# Patient Record
Sex: Male | Born: 1990 | Race: Black or African American | Hispanic: No | Marital: Single | Smoking: Never smoker
Health system: Southern US, Community
[De-identification: ages and names within clinical notes are randomized; demographics above are authoritative.]

## PROBLEM LIST (undated history)

## (undated) DIAGNOSIS — I472 Ventricular tachycardia, unspecified: Secondary | ICD-10-CM

## (undated) DIAGNOSIS — I43 Cardiomyopathy in diseases classified elsewhere: Secondary | ICD-10-CM

## (undated) DIAGNOSIS — B999 Unspecified infectious disease: Secondary | ICD-10-CM

## (undated) DIAGNOSIS — F419 Anxiety disorder, unspecified: Secondary | ICD-10-CM

---
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
## 2012-04-25 ENCOUNTER — Encounter: Payer: Self-pay | Admitting: Cardiovascular Disease

## 2012-05-18 ENCOUNTER — Encounter: Payer: Self-pay | Admitting: Cardiovascular Disease

## 2012-06-18 ENCOUNTER — Encounter: Payer: Self-pay | Admitting: Cardiovascular Disease

## 2012-07-18 ENCOUNTER — Encounter: Payer: Self-pay | Admitting: Cardiovascular Disease

## 2012-08-11 ENCOUNTER — Emergency Department: Payer: Self-pay | Admitting: *Deleted

## 2012-08-11 LAB — COMPREHENSIVE METABOLIC PANEL
Albumin: 4.2 g/dL (ref 3.4–5.0)
Alkaline Phosphatase: 80 U/L (ref 50–136)
Anion Gap: 10 (ref 7–16)
BUN: 10 mg/dL (ref 7–18)
Bilirubin,Total: 2 mg/dL — ABNORMAL HIGH (ref 0.2–1.0)
Calcium, Total: 9 mg/dL (ref 8.5–10.1)
Chloride: 105 mmol/L (ref 98–107)
Co2: 27 mmol/L (ref 21–32)
EGFR (Non-African Amer.): >60
Osmolality: 282 (ref 275–301)
Potassium: 3.5 mmol/L (ref 3.5–5.1)
Sodium: 142 mmol/L (ref 136–145)
Total Protein: 7.4 g/dL (ref 6.4–8.2)

## 2012-08-11 LAB — CBC WITH DIFFERENTIAL/PLATELET
Basophil %: 0.6 %
Eosinophil %: 3 %
HGB: 15.4 g/dL (ref 13.0–18.0)
Lymphocyte %: 35.1 %
MCH: 32.2 pg (ref 26.0–34.0)
Monocyte #: 0.4 x10 3/mm (ref 0.2–1.0)
Monocyte %: 10.4 %
Neutrophil #: 2 10*3/uL (ref 1.4–6.5)
Neutrophil %: 50.9 %
Platelet: 138 10*3/uL — ABNORMAL LOW (ref 150–440)
RBC: 4.79 10*6/uL (ref 4.40–5.90)
WBC: 3.8 10*3/uL (ref 3.8–10.6)

## 2012-08-11 LAB — MAGNESIUM: Magnesium: 1.8 mg/dL

## 2012-08-18 ENCOUNTER — Encounter: Payer: Self-pay | Admitting: Cardiovascular Disease

## 2012-09-17 ENCOUNTER — Encounter: Payer: Self-pay | Admitting: Cardiovascular Disease

## 2012-10-18 ENCOUNTER — Encounter: Payer: Self-pay | Admitting: Cardiovascular Disease

## 2012-11-18 ENCOUNTER — Encounter: Payer: Self-pay | Admitting: Cardiovascular Disease

## 2012-12-16 ENCOUNTER — Encounter: Payer: Self-pay | Admitting: Cardiovascular Disease

## 2013-10-22 ENCOUNTER — Emergency Department: Payer: Self-pay | Admitting: Emergency Medicine

## 2013-10-22 LAB — COMPREHENSIVE METABOLIC PANEL
AST: 27 U/L (ref 15–37)
Albumin: 3.7 g/dL (ref 3.4–5.0)
Alkaline Phosphatase: 78 U/L
Anion Gap: 9 (ref 7–16)
BUN: 13 mg/dL (ref 7–18)
Bilirubin,Total: 1.2 mg/dL — ABNORMAL HIGH (ref 0.2–1.0)
CHLORIDE: 103 mmol/L (ref 98–107)
CO2: 22 mmol/L (ref 21–32)
Calcium, Total: 8.6 mg/dL (ref 8.5–10.1)
Creatinine: 1.26 mg/dL (ref 0.60–1.30)
EGFR (African American): >60
GLUCOSE: 185 mg/dL — AB (ref 65–99)
Osmolality: 273 (ref 275–301)
Potassium: 3.6 mmol/L (ref 3.5–5.1)
SGPT (ALT): 23 U/L (ref 12–78)
SODIUM: 134 mmol/L — AB (ref 136–145)
Total Protein: 7.2 g/dL (ref 6.4–8.2)

## 2013-10-22 LAB — URINALYSIS, COMPLETE
BACTERIA: NONE SEEN
BILIRUBIN, UR: NEGATIVE
Blood: NEGATIVE
Glucose,UR: NEGATIVE mg/dL (ref 0–75)
Ketone: NEGATIVE
Leukocyte Esterase: NEGATIVE
Nitrite: NEGATIVE
PROTEIN: NEGATIVE
Ph: 7 (ref 4.5–8.0)
Specific Gravity: 1.01 (ref 1.003–1.030)
Squamous Epithelial: <1
WBC UR: <1 /HPF (ref 0–5)

## 2013-10-22 LAB — CBC
HCT: 44 % (ref 40.0–52.0)
HGB: 15.4 g/dL (ref 13.0–18.0)
MCH: 31.7 pg (ref 26.0–34.0)
MCHC: 34.9 g/dL (ref 32.0–36.0)
MCV: 91 fL (ref 80–100)
Platelet: 157 10*3/uL (ref 150–440)
RBC: 4.85 10*6/uL (ref 4.40–5.90)
RDW: 13.2 % (ref 11.5–14.5)
WBC: 4.7 10*3/uL (ref 3.8–10.6)

## 2013-10-22 LAB — CK TOTAL AND CKMB (NOT AT ARMC)
CK, Total: 143 U/L (ref 35–232)
CK-MB: 1.1 ng/mL (ref 0.5–3.6)

## 2013-10-22 LAB — DRUG SCREEN, URINE
Amphetamines, Ur Screen: NEGATIVE (ref ?–1000)
BENZODIAZEPINE, UR SCRN: NEGATIVE (ref ?–200)
Barbiturates, Ur Screen: NEGATIVE (ref ?–200)
Cannabinoid 50 Ng, Ur ~~LOC~~: POSITIVE (ref ?–50)
Cocaine Metabolite,Ur ~~LOC~~: NEGATIVE (ref ?–300)
MDMA (ECSTASY) UR SCREEN: NEGATIVE (ref ?–500)
Methadone, Ur Screen: NEGATIVE (ref ?–300)
Opiate, Ur Screen: NEGATIVE (ref ?–300)
Phencyclidine (PCP) Ur S: NEGATIVE (ref ?–25)
TRICYCLIC, UR SCREEN: NEGATIVE (ref ?–1000)

## 2013-10-22 LAB — TROPONIN I

## 2014-03-13 ENCOUNTER — Encounter (HOSPITAL_COMMUNITY): Payer: Self-pay | Admitting: Emergency Medicine

## 2014-03-13 ENCOUNTER — Emergency Department (HOSPITAL_COMMUNITY): Payer: BC Managed Care – PPO

## 2014-03-13 ENCOUNTER — Emergency Department (HOSPITAL_COMMUNITY)
Admission: EM | Admit: 2014-03-13 | Discharge: 2014-03-13 | Disposition: A | Discharge status: Other Acute Inpatient Hospital | Payer: BC Managed Care – PPO | Attending: Emergency Medicine | Admitting: Emergency Medicine

## 2014-03-13 DIAGNOSIS — R5383 Other fatigue: Secondary | ICD-10-CM

## 2014-03-13 DIAGNOSIS — R42 Dizziness and giddiness: Secondary | ICD-10-CM | POA: Insufficient documentation

## 2014-03-13 DIAGNOSIS — T82198A Other mechanical complication of other cardiac electronic device, initial encounter: Secondary | ICD-10-CM | POA: Insufficient documentation

## 2014-03-13 DIAGNOSIS — Z4502 Encounter for adjustment and management of automatic implantable cardiac defibrillator: Secondary | ICD-10-CM

## 2014-03-13 DIAGNOSIS — Y831 Surgical operation with implant of artificial internal device as the cause of abnormal reaction of the patient, or of later complication, without mention of misadventure at the time of the procedure: Secondary | ICD-10-CM | POA: Insufficient documentation

## 2014-03-13 DIAGNOSIS — R5381 Other malaise: Secondary | ICD-10-CM | POA: Insufficient documentation

## 2014-03-13 HISTORY — DX: Anxiety disorder, unspecified: F41.9

## 2014-03-13 HISTORY — DX: Unspecified infectious disease: B99.9

## 2014-03-13 HISTORY — DX: Cardiomyopathy in diseases classified elsewhere: I43

## 2014-03-13 LAB — CBC WITH DIFFERENTIAL/PLATELET
BASOS ABS: 0 10*3/uL (ref 0.0–0.1)
Basophils Relative: 0 % (ref 0–1)
EOS ABS: 0.2 10*3/uL (ref 0.0–0.7)
Eosinophils Relative: 3 % (ref 0–5)
HEMATOCRIT: 43.2 % (ref 39.0–52.0)
HEMOGLOBIN: 15.6 g/dL (ref 13.0–17.0)
Lymphocytes Relative: 22 % (ref 12–46)
Lymphs Abs: 1.3 10*3/uL (ref 0.7–4.0)
MCH: 32.3 pg (ref 26.0–34.0)
MCHC: 36.1 g/dL — AB (ref 30.0–36.0)
MCV: 89.4 fL (ref 78.0–100.0)
MONO ABS: 0.4 10*3/uL (ref 0.1–1.0)
MONOS PCT: 7 % (ref 3–12)
NEUTROS PCT: 68 % (ref 43–77)
Neutro Abs: 4 10*3/uL (ref 1.7–7.7)
Platelets: 157 10*3/uL (ref 150–400)
RBC: 4.83 MIL/uL (ref 4.22–5.81)
RDW: 12.6 % (ref 11.5–15.5)
WBC: 5.9 10*3/uL (ref 4.0–10.5)

## 2014-03-13 LAB — I-STAT TROPONIN, ED: Troponin i, poc: 0.08 ng/mL (ref 0.00–0.08)

## 2014-03-13 MED ORDER — AMIODARONE HCL IN DEXTROSE 360-4.14 MG/200ML-% IV SOLN
30.0000 mg/h | INTRAVENOUS | Status: DC
  Filled 2014-03-13: qty 200

## 2014-03-13 MED ORDER — AMIODARONE LOAD VIA INFUSION
150.0000 mg | Freq: Once | INTRAVENOUS | Status: AC
  Administered 2014-03-13: 150 mg via INTRAVENOUS
  Filled 2014-03-13: qty 83.34

## 2014-03-13 MED ORDER — AMIODARONE HCL IN DEXTROSE 360-4.14 MG/200ML-% IV SOLN
60.0000 mg/h | INTRAVENOUS | Status: DC
  Filled 2014-03-13 (×2): qty 200

## 2014-03-13 NOTE — Discharge Instructions (Signed)
You have been evaluate for your defibrillator discharge.  We will transfer you to cardiologist at [facility] for further management.

## 2014-03-13 NOTE — ED Notes (Signed)
Dr. Yao at the bedside. 

## 2014-03-13 NOTE — ED Notes (Signed)
Spoke with [employer] Rep. Patient currently on the [employer] Jude Transmission monitor.

## 2014-03-13 NOTE — ED Notes (Addendum)
Per EMS: Pt at work when he states "It started to feel funny and lightheaded" and then felt pacemaker fired. Pt AO x4, denies dizziness, CP, SOB or pain. EKG showed NSR with PVC's  Hx: dilated cardiomyopathy. 140/82. 86 reg. 18 RR.

## 2014-03-13 NOTE — ED Notes (Signed)
Report attempted to be called x1. [facility] stated they did not have the room assigned yet.

## 2014-03-13 NOTE — ED Notes (Signed)
Patient returned from Xray. Placed back on the monitor.  

## 2014-03-13 NOTE — ED Notes (Signed)
Gail, NP at the bedside.  

## 2014-03-13 NOTE — ED Provider Notes (Signed)
After several discussions with the transfer center at [facility].  Patient has been accepted by Dr. Mickle Asper to a bed at [facility].  This was discussed with the patient and his family.  Because the accepting  Cardiology is with a different group than his normal.  Cardiology, Dr. Yolanda Bonine, but is stated by Dr. Shiela Mayer that she will be working with Dr. Evelene Croon to coordinate patient's care.  He is agreeable to this transfer  Arman Filter, NP 03/13/14 2232

## 2014-03-13 NOTE — ED Notes (Signed)
RN attempted to interrogate Medtronic defibrillator without success. [employer] paged.

## 2014-03-13 NOTE — ED Notes (Signed)
Sandy from [place]. Jude called and stated she was unable to receive transmission of the Garment/textile technologist. Interrogator plugged back up to the receiver and waiting to transmit.

## 2014-03-13 NOTE — ED Notes (Signed)
[employer] called and reported patient had a heartrate of 250 and did receive a shock from his defibrillator. Greta Doom, PA notified.

## 2014-03-13 NOTE — ED Notes (Signed)
Phlebotomy at the bedside  

## 2014-03-13 NOTE — ED Provider Notes (Signed)
CSN: [id number]     Arrival date & time 03/13/14  1649 History   First MD Initiated Contact with Patient 03/13/14 1650     Chief Complaint  Patient presents with  . Pacemaker Problem     (Consider location/radiation/quality/duration/timing/severity/associated sxs/prior Treatment) HPI  23 year old male who presents for evaluation of "defibrillator firing". Patient reports he was sitting at work today when he felt funny follows with sensation of lightheadedness and weakness and shortly he felt that his pacemaker has recently fired. After the firing he felt normal again. He reports having a similar firing back in December of last year, and [facility]. He otherwise denies any recent sickness.  No active fever, chills, cp, sob, productive cough, n/v/d, diaphoresis, or any other acute changes.  Hx of dilated cardiomyopathy likely 2/2 to infection of unknown sources causing his sxs.  He denies any significant family hx of cardiac disease.  Not an IVDU, not a smoker, denies alcohol abuse.  [facility].      No past medical history on file. No past surgical history on file. No family history on file. History  Substance Use Topics  . Smoking status: Not on file  . Smokeless tobacco: Not on file  . Alcohol Use: Not on file    Review of Systems  All other systems reviewed and are negative.     Allergies  Review of patient's allergies indicates not on file.  Home Medications   Prior to Admission medications   Not on File   There were no vitals taken for this visit. Physical Exam  Constitutional: He is oriented to person, place, and time. He appears well-developed and well-nourished. No distress.  HENT:  Head: Atraumatic.  Eyes: Conjunctivae are normal.  Neck: Normal range of motion. Neck supple.  Cardiovascular: Normal rate and regular rhythm.   Pulmonary/Chest: Effort normal and breath sounds normal. He has no wheezes. He exhibits no tenderness.  ICD  to Left upper chest, normal appearance, nontender on exam.    Abdominal: Soft. There is no tenderness.  Musculoskeletal: He exhibits no edema.  Neurological: He is alert and oriented to person, place, and time.  Skin: No rash noted.  Psychiatric: He has a normal mood and affect.    ED Course  Procedures (including critical care time)  5:14 PM Pt report firing of his defibrillator at work today.  He is back to normal baseline.  Pt has medtronic ICD.  I will interrogate it.  Will check basic labs, CXR, trop and will consult cardiologist with result.  Care discussed with Dr. Silverio LayYao.  5:48 PM Pharmacy tech report that pt has not been compliant with his medications including his BP medication.    8:20 PM Pt's ICD was interrogated and demonstrates sustained VFIB when pt was shocked.  Therefore, we will load pt with antiarrhythmic, amiodarone and will call [facility] transfer line [facility].  Pt current hemodynamically stable and in NAD.  8:37 PM I have consulted with [facility] cardiology department who request [facility].  Cardiologist is Dr. Deatra RobinsonPam Douglas.    8:47 PM The PALS line report that currently [facility] ER does not accept transfer, however they will contact their sister site [facility] for [facility].  Pt agrees with plan.  Care discussed with oncoming provider who will talk to [facility] [place] transfer.  Pt previous cardiologist  is Dr. Willia Craze.  He is schedule to f/u with another cardiologist on June 22nd.    Labs Review Labs Reviewed  CBC WITH DIFFERENTIAL - Abnormal; Notable for the following:    MCHC 36.1 (*)    All other components within normal limits  I-STAT CHEM 8, ED  Rosezena Sensor, ED    Imaging Review Dg Chest 2 View  03/13/2014   CLINICAL DATA:  Defibrillator fired. Dilated cardiomyopathy. Tachycardia.  EXAM: CHEST  2 VIEW  COMPARISON:  10/22/2013  FINDINGS: Borderline cardiomegaly  remains stable. Single lead transvenous pacemaker remains in appropriate position. No evidence of pulmonary infiltrate or edema. No evidence of pneumothorax or pleural effusion. No mass or lymphadenopathy identified.  IMPRESSION: Stable borderline cardiomegaly.  No active lung disease.   Electronically Signed   By: Myles Rosenthal M.D.   On: 03/13/2014 18:55     EKG Interpretation   Date/Time:  Wednesday Mar 13 2014 16:57:19 EDT Ventricular Rate:  83 PR Interval:  202 QRS Duration: 112 QT Interval:  378 QTC Calculation: 444 R Axis:   -90 Text Interpretation:  Sinus rhythm Borderline prolonged PR interval Left  anterior fascicular block Consider right ventricular hypertrophy Abnrm T,  consider ischemia, anterolateral lds No previous ECGs available Confirmed  by YAO  MD, DAVID ([id number]) on 03/13/2014 5:01:41 PM      MDM   Final diagnoses:  Defibrillator discharge    BP 114/69  Pulse 80  Temp(Src) 97.7 F (36.5 C) (Oral)  Resp 17  SpO2 99%  I have reviewed nursing notes and vital signs. I personally reviewed the imaging tests through PACS system  I reviewed available ER/hospitalization records thought the EMR     Fayrene Helper, [place] 03/13/14 2052

## 2014-03-13 NOTE — ED Notes (Signed)
Patient has medtronic card that sts ICD is St. Jude. Patient unaware of type of defibrillator. [employer] Jude to be paged.

## 2014-03-14 NOTE — ED Provider Notes (Signed)
Medical screening examination/treatment/procedure(s) were conducted as a shared visit with non-physician practitioner(s) and myself.  I personally evaluated the patient during the encounter.   EKG Interpretation   Date/Time:  Wednesday Mar 13 2014 16:57:19 EDT Ventricular Rate:  83 PR Interval:  202 QRS Duration: 112 QT Interval:  378 QTC Calculation: 444 R Axis:   -90 Text Interpretation:  Sinus rhythm Borderline prolonged PR interval Left  anterior fascicular block Consider right ventricular hypertrophy Abnrm T,  consider ischemia, anterolateral lds No previous ECGs available Confirmed  by Dalicia Kisner  MD, Danne Vasek ([id number]) on 03/13/2014 5:01:41 PM      Stephano Aronow is a 23 y.o. male hx of cardiomyopathy with pacemaker here because he felt that pacemaker might have fired earlier today. Denies chest pain currently. Labs stable, trop neg. Pacemaker interrogated and patient had a run of V tach. Patient loaded with amiodarone and started on amiodarone drip. The PA contacted [facility] (pacemaker placed there) and will arrange for transfer for V tach causing pacemaker to fire.   CRITICAL CARE Performed by: Richardean Canal   Total critical care time: 30 min  Critical care time was exclusive of separately billable procedures and treating other patients.  Critical care was necessary to treat or prevent imminent or life-threatening deterioration.  Critical care was time spent personally by me on the following activities: development of treatment plan with patient and/or surrogate as well as nursing, discussions with consultants, evaluation of patient's response to treatment, examination of patient, obtaining history from patient or surrogate, ordering and performing treatments and interventions, ordering and review of laboratory studies, ordering and review of radiographic studies, pulse oximetry and re-evaluation of patient's condition.    Richardean Canal, MD 03/14/14 [phone]

## 2014-03-14 NOTE — ED Provider Notes (Signed)
Medical screening examination/treatment/procedure(s) were conducted as a shared visit with non-physician practitioner(s) and myself.  I personally evaluated the patient during the encounter.   EKG Interpretation   Date/Time:  Wednesday Mar 13 2014 16:57:19 EDT Ventricular Rate:  83 PR Interval:  202 QRS Duration: 112 QT Interval:  378 QTC Calculation: 444 R Axis:   -90 Text Interpretation:  Sinus rhythm Borderline prolonged PR interval Left  anterior fascicular block Consider right ventricular hypertrophy Abnrm T,  consider ischemia, anterolateral lds No previous ECGs available Confirmed  by YAO  MD, DAVID ([id number]) on 03/13/2014 5:01:41 PM      See my separate note   Richardean Canal, MD 03/14/14 [phone]

## 2014-10-14 ENCOUNTER — Emergency Department (HOSPITAL_COMMUNITY): Payer: BC Managed Care – PPO

## 2014-10-14 ENCOUNTER — Inpatient Hospital Stay (HOSPITAL_COMMUNITY)
Admission: EM | Admit: 2014-10-14 | Discharge: 2014-10-18 | DRG: 309 | Disposition: A | Discharge status: Home / Self Care | Payer: BC Managed Care – PPO | Attending: Internal Medicine | Admitting: Internal Medicine

## 2014-10-14 ENCOUNTER — Encounter (HOSPITAL_COMMUNITY): Payer: Self-pay | Admitting: *Deleted

## 2014-10-14 DIAGNOSIS — I472 Ventricular tachycardia, unspecified: Secondary | ICD-10-CM

## 2014-10-14 DIAGNOSIS — I420 Dilated cardiomyopathy: Secondary | ICD-10-CM | POA: Diagnosis present

## 2014-10-14 DIAGNOSIS — Z95810 Presence of automatic (implantable) cardiac defibrillator: Secondary | ICD-10-CM

## 2014-10-14 DIAGNOSIS — I5022 Chronic systolic (congestive) heart failure: Secondary | ICD-10-CM | POA: Diagnosis present

## 2014-10-14 DIAGNOSIS — I514 Myocarditis, unspecified: Secondary | ICD-10-CM | POA: Diagnosis present

## 2014-10-14 DIAGNOSIS — F419 Anxiety disorder, unspecified: Secondary | ICD-10-CM | POA: Diagnosis present

## 2014-10-14 DIAGNOSIS — R0602 Shortness of breath: Secondary | ICD-10-CM

## 2014-10-14 DIAGNOSIS — I959 Hypotension, unspecified: Secondary | ICD-10-CM | POA: Diagnosis present

## 2014-10-14 HISTORY — DX: Ventricular tachycardia: I47.2

## 2014-10-14 HISTORY — DX: Ventricular tachycardia, unspecified: I47.20

## 2014-10-14 LAB — CBC WITH DIFFERENTIAL/PLATELET
Basophils Absolute: 0 10*3/uL (ref 0.0–0.1)
Basophils Relative: 0 % (ref 0–1)
EOS ABS: 0.1 10*3/uL (ref 0.0–0.7)
Eosinophils Relative: 1 % (ref 0–5)
HEMATOCRIT: 45.4 % (ref 39.0–52.0)
Hemoglobin: 16.2 g/dL (ref 13.0–17.0)
LYMPHS ABS: 0.6 10*3/uL — AB (ref 0.7–4.0)
Lymphocytes Relative: 7 % — ABNORMAL LOW (ref 12–46)
MCH: 31.6 pg (ref 26.0–34.0)
MCHC: 35.7 g/dL (ref 30.0–36.0)
MCV: 88.5 fL (ref 78.0–100.0)
MONO ABS: 0.4 10*3/uL (ref 0.1–1.0)
MONOS PCT: 5 % (ref 3–12)
Neutro Abs: 7.5 10*3/uL (ref 1.7–7.7)
Neutrophils Relative %: 87 % — ABNORMAL HIGH (ref 43–77)
Platelets: 155 10*3/uL (ref 150–400)
RBC: 5.13 MIL/uL (ref 4.22–5.81)
RDW: 12.5 % (ref 11.5–15.5)
WBC: 8.7 10*3/uL (ref 4.0–10.5)

## 2014-10-14 LAB — I-STAT TROPONIN, ED: Troponin i, poc: 0.16 ng/mL (ref 0.00–0.08)

## 2014-10-14 LAB — I-STAT CHEM 8, ED
BUN: 8 mg/dL (ref 6–23)
Calcium, Ion: 1.15 mmol/L (ref 1.12–1.23)
Chloride: 102 mEq/L (ref 96–112)
Creatinine, Ser: 1.2 mg/dL (ref 0.50–1.35)
Glucose, Bld: 89 mg/dL (ref 70–99)
HCT: 50 % (ref 39.0–52.0)
Hemoglobin: 17 g/dL (ref 13.0–17.0)
Potassium: 4.3 mmol/L (ref 3.5–5.1)
SODIUM: 139 mmol/L (ref 135–145)
TCO2: 23 mmol/L (ref 0–100)

## 2014-10-14 LAB — MRSA PCR SCREENING: MRSA BY PCR: NEGATIVE

## 2014-10-14 MED ORDER — ACETAMINOPHEN 325 MG PO TABS: 650.0000 mg | ORAL_TABLET | Freq: Four times a day (QID) | ORAL | Status: DC | PRN

## 2014-10-14 MED ORDER — LORATADINE 10 MG PO TABS
10.0000 mg | ORAL_TABLET | Freq: Every day | ORAL | Status: DC
  Administered 2014-10-14 – 2014-10-18 (×5): 10 mg via ORAL
  Filled 2014-10-14 (×5): qty 1

## 2014-10-14 MED ORDER — ONDANSETRON HCL 4 MG/2ML IJ SOLN: 4.0000 mg | Freq: Four times a day (QID) | INTRAMUSCULAR | Status: DC | PRN

## 2014-10-14 MED ORDER — AMIODARONE HCL IN DEXTROSE 360-4.14 MG/200ML-% IV SOLN
INTRAVENOUS | Status: AC
  Filled 2014-10-14: qty 200

## 2014-10-14 MED ORDER — AMIODARONE HCL IN DEXTROSE 360-4.14 MG/200ML-% IV SOLN
60.0000 mg/h | Freq: Once | INTRAVENOUS | Status: AC
  Administered 2014-10-14 (×2): 60 mg/h via INTRAVENOUS

## 2014-10-14 MED ORDER — SODIUM CHLORIDE 0.9 % IV BOLUS (SEPSIS)
500.0000 mL | Freq: Once | INTRAVENOUS | Status: AC
  Administered 2014-10-15: 500 mL via INTRAVENOUS

## 2014-10-14 MED ORDER — LISINOPRIL 2.5 MG PO TABS
2.5000 mg | ORAL_TABLET | Freq: Every day | ORAL | Status: DC
  Administered 2014-10-14 – 2014-10-15 (×2): 2.5 mg via ORAL
  Filled 2014-10-14 (×2): qty 1

## 2014-10-14 MED ORDER — ENOXAPARIN SODIUM 40 MG/0.4ML ~~LOC~~ SOLN
40.0000 mg | SUBCUTANEOUS | Status: DC
  Administered 2014-10-14 – 2014-10-17 (×4): 40 mg via SUBCUTANEOUS
  Filled 2014-10-14 (×5): qty 0.4

## 2014-10-14 MED ORDER — AMIODARONE HCL IN DEXTROSE 360-4.14 MG/200ML-% IV SOLN
INTRAVENOUS | Status: AC
  Administered 2014-10-14: 60 mg/h via INTRAVENOUS
  Filled 2014-10-14: qty 200

## 2014-10-14 MED ORDER — ALPRAZOLAM 0.25 MG PO TABS
0.2500 mg | ORAL_TABLET | Freq: Two times a day (BID) | ORAL | Status: DC | PRN
  Administered 2014-10-16: 0.25 mg via ORAL
  Filled 2014-10-14 (×2): qty 1

## 2014-10-14 MED ORDER — SOTALOL HCL 80 MG PO TABS
160.0000 mg | ORAL_TABLET | Freq: Two times a day (BID) | ORAL | Status: DC
  Administered 2014-10-14 – 2014-10-15 (×2): 160 mg via ORAL
  Filled 2014-10-14 (×3): qty 2

## 2014-10-14 MED ORDER — ACETAMINOPHEN 325 MG PO TABS
650.0000 mg | ORAL_TABLET | ORAL | Status: DC | PRN
  Administered 2014-10-14 – 2014-10-15 (×2): 650 mg via ORAL
  Filled 2014-10-14 (×3): qty 2

## 2014-10-14 MED ORDER — ZOLPIDEM TARTRATE 5 MG PO TABS: 5.0000 mg | ORAL_TABLET | Freq: Every evening | ORAL | Status: DC | PRN

## 2014-10-14 NOTE — ED Notes (Signed)
Pt arrived via [facility] from home. Pt had a near syncopal episode and was in vtach when EMS arrived. Pt had 75mg  of amiodarone and converted PTA. Pt was in v tach once roomed in ED and received a 75mg  bolus of amiodarone. Pt has a hx of dilated cardiomyopathy and has a defibrillator/pacemaker. Pt was found htn and after amio drip was hypotensive received 300ml of NS.

## 2014-10-14 NOTE — ED Provider Notes (Signed)
CSN: [id number]     Arrival date & time 10/14/14  1402 History   First MD Initiated Contact with Patient 10/14/14 1415     Chief Complaint  Patient presents with  . Tachycardia     (Consider location/radiation/quality/duration/timing/severity/associated sxs/prior Treatment) HPI   Matthew Woodward is a 23 y.o. male who presents for evaluation of palpitations.  The palpitations have been present for one hour prior to coming here.  He called EMS and was transferred here for evaluation.  During transport.  EMS noted that he was in ventricular tachycardia.  They treated him with IV amiodarone.  He had transient improvement of the headache, arrhythmia, but upon arrival, he was in V. tach again.  The patient was able to communicate and seems to be tolerating the V. tach very well.  He denies recent problems similar to this.  He states that his defibrillator, has not fired.  He recently had some medication changes, but he is not sure what they are.  He does not have any local cardiology care.  He denies recent fever, chills, nausea, vomiting, cough, shortness of breath or episodes of chest pain.  There are no other known modifying factors.  Past Medical History  Diagnosis Date  . 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 Anxiety   . Ventricular tachycardia    Past Surgical History  Procedure Laterality Date  . Implantable cardioverter defibrillator implant  2012    STJ single chamber ICD implanted at [facility] - followed by Dr Gerre Pebbles   Family History  Problem Relation Age of Onset  . Diabetes Mother   . Thyroid disease Mother    History  Substance Use Topics  . Smoking status: Never Smoker   . Smokeless tobacco: Not on file  . Alcohol Use: No    Review of Systems  All other systems reviewed and are negative.     Allergies  Review of patient's allergies indicates no known allergies.  Home Medications   Prior to Admission medications   Medication Sig Start Date End  Date Taking? Authorizing Provider  acetaminophen (TYLENOL) 325 MG tablet Take 650 mg by mouth every 6 (six) hours as needed for mild pain.   Yes Historical Provider, MD  cetirizine (ZYRTEC) 10 MG tablet Take 10 mg by mouth daily.   Yes Historical Provider, MD  lisinopril (PRINIVIL,ZESTRIL) 2.5 MG tablet Take 2.5 mg by mouth daily.   Yes Historical Provider, MD  sotalol (BETAPACE) 80 MG tablet Take 80 mg by mouth 2 (two) times daily.   Yes Historical Provider, MD   BP 99/79 mmHg  Pulse 84  Resp 19  Ht 6' (1.829 m)  SpO2 100% Physical Exam  Constitutional: He is oriented to person, place, and time. He appears well-developed and well-nourished.  HENT:  Head: Normocephalic and atraumatic.  Right Ear: External ear normal.  Left Ear: External ear normal.  Eyes: Conjunctivae and EOM are normal. Pupils are equal, round, and reactive to light.  Neck: Normal range of motion and phonation normal. Neck supple.  Cardiovascular: Normal heart sounds.   Tachycardia.  Faint right radial pulse with intermittent pulsations indicating incomplete perfusion of all cardiac beats.  Pulmonary/Chest: Effort normal and breath sounds normal. He exhibits no bony tenderness.  Abdominal: Soft. There is no tenderness.  Musculoskeletal: Normal range of motion.  Neurological: He is alert and oriented to person, place, and time. No cranial nerve deficit or sensory deficit. He exhibits normal muscle tone. Coordination normal.  Skin: Skin  is warm, dry and intact.  Psychiatric: He has a normal mood and affect. His behavior is normal. Judgment and thought content normal.  Nursing note and vitals reviewed.   ED Course  Procedures (including critical care time)  Initial urgent treatment.  He was given additional in your arm bolus, 50 mg, and started on amiodarone drip/  Pacer pads, and difficult bladder, placed, as precaution.  Consultation with cardiology  Medications  acetaminophen (TYLENOL) tablet 650 mg (not  administered)  loratadine (CLARITIN) tablet 10 mg (10 mg Oral Given 10/14/14 1517)  lisinopril (PRINIVIL,ZESTRIL) tablet 2.5 mg (not administered)  amiodarone (NEXTERONE PREMIX) 360 MG/200ML (1.8 mg/mL) IV infusion (60 mg/hr Intravenous New Bag/Given 10/14/14 1410)    Patient Vitals for the past 24 hrs:  BP Pulse Resp SpO2 Height  10/14/14 1500 99/79 mmHg 84 19 100 % -  10/14/14 1445 127/87 mmHg 96 20 99 % -  10/14/14 1430 112/73 mmHg 86 15 99 % -  10/14/14 1417 - - - - 6' (1.829 m)  10/14/14 1415 92/56 mmHg 89 22 100 % -  10/14/14 1410 (!) 86/65 mmHg (!) 168 23 99 % -   Cardiology arrived and decided to admit the patient  CRITICAL CARE Performed by: Flint MelterWENTZ,Quintan Saldivar L Total critical care time: 30 minutes Critical care time was exclusive of separately billable procedures and treating other patients. Critical care was necessary to treat or prevent imminent or life-threatening deterioration. Critical care was time spent personally by me on the following activities: development of treatment plan with patient and/or surrogate as well as nursing, discussions with consultants, evaluation of patient's response to treatment, examination of patient, obtaining history from patient or surrogate, ordering and performing treatments and interventions, ordering and review of laboratory studies, ordering and review of radiographic studies, pulse oximetry and re-evaluation of patient's condition.   Labs Review Labs Reviewed  CBC WITH DIFFERENTIAL - Abnormal; Notable for the following:    Neutrophils Relative % 87 (*)    Lymphocytes Relative 7 (*)    Lymphs Abs 0.6 (*)    All other components within normal limits  I-STAT TROPOININ, ED - Abnormal; Notable for the following:    Troponin i, poc 0.16 (*)    All other components within normal limits  I-STAT CHEM 8, ED    Imaging Review Dg Chest Port 1 View  10/14/2014   CLINICAL DATA:  Diaphoresis and near syncope ; episode of ventricular tachycardia   EXAM: PORTABLE CHEST - 1 VIEW  COMPARISON:  Mar 13, 2014  FINDINGS: Defibrillator present with lead attached to the right ventricle. Lungs clear. Heart is upper normal in size with pulmonary vascularity within normal limits. No pneumothorax. No adenopathy. No bone lesions.  IMPRESSION: No edema or consolidation.  Heart upper normal in size.   Electronically Signed   By: Bretta BangWilliam  Woodruff M.D.   On: 10/14/2014 15:06     EKG Interpretation   Date/Time:  Monday October 14 2014 14:10:26 EST Ventricular Rate:  158 PR Interval:  54 QRS Duration: 141 QT Interval:  349 QTC Calculation: 566 R Axis:   -59 Text Interpretation:  Sinus tachycardia LVH with IVCD, LAD and secondary  repol abnrm Inferior infarct, old Prolonged QT interval Since last tracing  rate faster Confirmed by [facility]  MD, Kyndal Gloster ([id number]) on 10/14/2014 2:17:02  PM      MDM   Final diagnoses:  None    Ventricular tachycardia, with history of cardiomyopathy.  Mild elevation of troponin is consistent with troponin leak  associated with ventricular tachycardia.  No evidence for STEMI.  He will require admission for further treatment and assessment.   Nursing Notes Reviewed/ Care Coordinated, and agree without changes. Applicable Imaging Reviewed.  Interpretation of Laboratory Data incorporated into ED treatment  Plan: Admit    Flint MelterElliott L Andreah Goheen, MD 10/14/14 1524

## 2014-10-14 NOTE — H&P (Signed)
ELECTROPHYSIOLOGY HISTORY AND PHYSICAL    Patient ID: Matthew Woodward MRN: [id number], DOB/AGE: 22/03/1991 22 y.o.  Admit date: 10/14/2014 Date of Consult: 10/14/2014  Primary Physician: PROVIDER NOT IN SYSTEM Primary Cardiologist: Jenel LucksKarra, Ravi ([facility]) Electrophysiologist: Gerre PebblesKevin Thomas ([facility])   CC: VT  HPI:  Matthew Woodward is a 23 y.o. 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  [facility] and is s/p STJ single chamber ICD implantation in 2012.  His last appropriate ICD therapy was in May of this year for monomorphic VT at a cycle length of 230msec.  At that time, his Coreg was changed to Sotalol 80 mg twice daily.  He did well until this morning without recurrent ICD therapies.  This morning, he developed weakness and fatigue and called EMS for evaluation.  On their arrival, he was in a WCT at 200bpm.  He was given amiodarone and [facility].  He had persistent ventricular tachycardia at rates of 150-170bpm and was given more amiodarone with quieting of ventricular arrhythmias.  He states that he has been in good health recently.  He has been trying to exercise more and has done so without limitation.  He has not had recent chest pain, shortness of breath, LE edema, fevers, chills, nausea or vomiting.  He has almost daily palpitations that he has had for years and are not associated with syncope or pre-syncope.    Home meds include Sotalol 80mg  1 tablet twice daily and Lisinopril 2.5mg  1 tablet daily.  HF medications have been unable to be uptitrated further due to hypotension.  He recently [facility] with some cardiac limitation noted.   EP has been asked to admit.    Past Medical History  Diagnosis Date  . 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 Anxiety      Surgical History: History reviewed. No pertinent past  surgical history.   Prior to Admission medications   Medication Sig Start Date End Date Taking? Authorizing Provider  acetaminophen (TYLENOL) 325 MG tablet Take 650 mg by mouth every 6 (six) hours as needed for mild pain.    Historical Provider, MD  cetirizine (ZYRTEC) 10 MG tablet Take 10 mg by mouth daily.    Historical Provider, MD  lisinopril (PRINIVIL,ZESTRIL) 2.5 MG tablet Take 2.5 mg by mouth daily.    Historical Provider, MD     Allergies: No Known Allergies  History   Social History  . Marital Status: Single    Spouse Name: N/A    Number of Children: N/A  . Years of Education: N/A   Occupational History  . Not on file.   Social History Main Topics  . Smoking status: Never Smoker   . Smokeless tobacco: Not on file  . Alcohol Use: No  . Drug Use: No  . Sexual Activity: Not on file   Other Topics Concern  . Not on file   Social History Narrative     Family History  Problem Relation Age of Onset  . Diabetes Mother   . Thyroid disease Mother     BP 112/73 mmHg  Pulse 86  Resp 15  Ht 6' (1.829 m)  SpO2 99%  Physical Exam: Well appearing young man, NAD HEENT: Unremarkable,[place], AT Neck:  6 JVD, no thyromegally Back:  No CVA tenderness Lungs:  Clear with no wheezes, rales, or rhonchi, well healed ICD incision.  HEART:  Regular rate rhythm, no murmurs, no rubs, no clicks, soft S3 Abd:  soft, positive bowel sounds, no organomegally, no rebound, no guarding Ext:  2 plus pulses, no edema, no cyanosis, no clubbing Skin:  No rashes no nodules Neuro:  CN II through XII intact, motor grossly intact   Labs:   Lab Results  Component Value Date   WBC 8.7 10/14/2014   HGB 17.0 10/14/2014   HCT 50.0 10/14/2014   MCV 88.5 10/14/2014   PLT 155 10/14/2014    Recent Labs Lab 10/14/14 1432  NA 139  K 4.3  CL 102  BUN 8  CREATININE 1.20  GLUCOSE 89    Radiology/Studies: No results found.  EKG:VT, rate 158, QRS 141  TELEMETRY: sinus rhythm with  intermittent runs of VT  DEVICE HISTORY: STJ Fortify ICD model number 1257 with 6935 right ventricular lead implanted 07-10-11 by Dr [facility].  Device interrogation demonstrates normal device function with no recent ICD therapies.  VF zone reprogrammed from 220bpm to 200bpm with intervals extended from 16 beats per episode to 30 beats per episode before therapy is given.  VT monitor zone left unchanged.   A/P 1. Recurrent VT 2. Non-ischemic CM 3. Chronic class 2 CHF 4. S/p ICD Rec: he is on a very small dose of sotalol and will need attempted uptitration from 80 mg twice daily to 160 mg twice daily. Hopefully his blood pressure will tolerate. His heart failure appears to be well compensated based on his history. Hopefully he can be discharged home tomorrow. We have reprogrammed device to treat VT above 200/min.  Leonia ReevesGregg Taylor,M.D.

## 2014-10-14 NOTE — Progress Notes (Addendum)
Pt had sustained tachycardia 130-140 for two minutes. Pt has no complaints of shortness of breath or chest pain. MD notified.    Alba DestineMisty L Ennis, RN, BSN

## 2014-10-14 NOTE — ED Notes (Signed)
Dr. Taylor at bedside.

## 2014-10-14 NOTE — ED Notes (Signed)
Attempted report to [facility]. Will call back in ten minutes.

## 2014-10-14 NOTE — ED Notes (Signed)
Dr. Ladona Ridgelaylor notified of troponin 0.16 by Fayrene FearingJames from lab.

## 2014-10-14 NOTE — Progress Notes (Signed)
Pt had multiple runs of V. Tach and HR 120-180 from 2155-2211. Pt stated that he felt as if his heart was racing. He had no complaints of chest pain or shortness of breath. Cardiology MD on call has been called and text paged multiple times since 1900 with no response. RN called Elink, rapid response, and house coverage for assistance on reaching a MD. Currently pt vitals are HR 71 and BP 92/63 and resting. Will continue to monitor.    Alba DestineMisty L Ennis, RN, BSN

## 2014-10-14 NOTE — ED Notes (Signed)
 50mg  bolus of amiodarone

## 2014-10-14 NOTE — ED Notes (Signed)
Pt woke up feeling unwell this morning. Pt had a near syncopal episode and was diaphoretic with weak bilateral radial pulses, shivering and in vtach.

## 2014-10-14 NOTE — ED Notes (Signed)
 75mg  amiodarone

## 2014-10-14 NOTE — ED Notes (Signed)
Cards at bedside

## 2014-10-14 NOTE — Progress Notes (Signed)
Pr received per stretcher from ED Pt alert/oriented Pacing patches on chest

## 2014-10-14 NOTE — ED Notes (Signed)
Pt being admitted to the floor. Report given to Grand Ridgeroyce, Charity fundraiserN. Pt. Verbalizes understanding rt admission and f/u care.

## 2014-10-14 NOTE — ED Notes (Signed)
Troponin results given to Dr. Effie ShyWentz and the private cardiologist informed as well.

## 2014-10-15 DIAGNOSIS — Z95810 Presence of automatic (implantable) cardiac defibrillator: Secondary | ICD-10-CM | POA: Diagnosis not present

## 2014-10-15 DIAGNOSIS — I420 Dilated cardiomyopathy: Secondary | ICD-10-CM | POA: Diagnosis present

## 2014-10-15 DIAGNOSIS — I472 Ventricular tachycardia: Principal | ICD-10-CM

## 2014-10-15 DIAGNOSIS — I514 Myocarditis, unspecified: Secondary | ICD-10-CM | POA: Diagnosis present

## 2014-10-15 DIAGNOSIS — I369 Nonrheumatic tricuspid valve disorder, unspecified: Secondary | ICD-10-CM

## 2014-10-15 DIAGNOSIS — F419 Anxiety disorder, unspecified: Secondary | ICD-10-CM | POA: Diagnosis present

## 2014-10-15 DIAGNOSIS — I5022 Chronic systolic (congestive) heart failure: Secondary | ICD-10-CM | POA: Diagnosis present

## 2014-10-15 DIAGNOSIS — I959 Hypotension, unspecified: Secondary | ICD-10-CM | POA: Diagnosis present

## 2014-10-15 LAB — BASIC METABOLIC PANEL
ANION GAP: 7 (ref 5–15)
BUN: 6 mg/dL (ref 6–23)
CO2: 24 mmol/L (ref 19–32)
Calcium: 8.6 mg/dL (ref 8.4–10.5)
Chloride: 106 mEq/L (ref 96–112)
Creatinine, Ser: 1.13 mg/dL (ref 0.50–1.35)
Glucose, Bld: 95 mg/dL (ref 70–99)
POTASSIUM: 3.6 mmol/L (ref 3.5–5.1)
Sodium: 137 mmol/L (ref 135–145)

## 2014-10-15 MED ORDER — SODIUM CHLORIDE 0.9 % IV BOLUS (SEPSIS)
250.0000 mL | Freq: Once | INTRAVENOUS | Status: AC
  Administered 2014-10-15: 250 mL via INTRAVENOUS

## 2014-10-15 MED ORDER — LORAZEPAM 0.5 MG PO TABS
0.5000 mg | ORAL_TABLET | Freq: Two times a day (BID) | ORAL | Status: DC
  Administered 2014-10-15 – 2014-10-18 (×7): 0.5 mg via ORAL
  Filled 2014-10-15 (×6): qty 1

## 2014-10-15 MED ORDER — PHENOL 1.4 % MT LIQD
1.0000 | OROMUCOSAL | Status: DC | PRN
  Administered 2014-10-15 – 2014-10-16 (×2): 1 via OROMUCOSAL
  Filled 2014-10-15: qty 177

## 2014-10-15 MED ORDER — BENZOCAINE (TOPICAL) 20 % EX AERO: INHALATION_SPRAY | Freq: Four times a day (QID) | CUTANEOUS | Status: DC | PRN

## 2014-10-15 MED ORDER — CETYLPYRIDINIUM CHLORIDE 0.05 % MT LIQD
7.0000 mL | Freq: Two times a day (BID) | OROMUCOSAL | Status: DC
  Administered 2014-10-15 – 2014-10-17 (×3): 7 mL via OROMUCOSAL

## 2014-10-15 MED ORDER — AMIODARONE HCL IN DEXTROSE 360-4.14 MG/200ML-% IV SOLN: 30.0000 mg/h | INTRAVENOUS | Status: DC

## 2014-10-15 MED ORDER — PERFLUTREN LIPID MICROSPHERE
1.0000 mL | INTRAVENOUS | Status: AC | PRN
  Administered 2014-10-15 (×2): 2 mL via INTRAVENOUS

## 2014-10-15 MED ORDER — POTASSIUM CHLORIDE CRYS ER 20 MEQ PO TBCR
40.0000 meq | EXTENDED_RELEASE_TABLET | Freq: Once | ORAL | Status: AC
  Administered 2014-10-15: 40 meq via ORAL
  Filled 2014-10-15: qty 2

## 2014-10-15 MED ORDER — LIDOCAINE IN D5W 4-5 MG/ML-% IV SOLN
1.0000 mg/min | INTRAVENOUS | Status: DC
  Administered 2014-10-15: 1 mg/min via INTRAVENOUS
  Filled 2014-10-15: qty 500

## 2014-10-15 MED ORDER — AMIODARONE HCL IN DEXTROSE 360-4.14 MG/200ML-% IV SOLN
30.0000 mg/h | INTRAVENOUS | Status: DC
  Administered 2014-10-15 (×2): 30 mg/h via INTRAVENOUS
  Filled 2014-10-15 (×2): qty 200

## 2014-10-15 MED ORDER — LIDOCAINE BOLUS VIA INFUSION
75.0000 mg | Freq: Once | INTRAVENOUS | Status: AC
  Administered 2014-10-15: 75 mg via INTRAVENOUS
  Filled 2014-10-15: qty 76

## 2014-10-15 MED ORDER — SOTALOL HCL 120 MG PO TABS
120.0000 mg | ORAL_TABLET | Freq: Two times a day (BID) | ORAL | Status: DC
  Administered 2014-10-15 – 2014-10-18 (×6): 120 mg via ORAL
  Filled 2014-10-15 (×7): qty 1

## 2014-10-15 NOTE — Progress Notes (Signed)
D/C Amiodarone drip IV

## 2014-10-15 NOTE — Progress Notes (Addendum)
Pt had 5 minute long tachycardia 140-145. Rapid response and central monitoring called. Brooke, from rapid response, states the rate was ST and not V.Tach. Will continue to monitor.   Alba DestineMisty L Ennis, RN, BSN

## 2014-10-15 NOTE — Progress Notes (Signed)
Lidocaine bolus 75 mg given IV & started gtt  at 4 mg/ml

## 2014-10-15 NOTE — Progress Notes (Signed)
Pt BP was 84/40 after 500 cc bolus finish. MD notified and ordered an additional 250cc bolus over 1 hour. Will report to AM RN to monitor BP and HR closely.   Alba DestineMisty L Ennis, RN, BSN

## 2014-10-15 NOTE — Progress Notes (Addendum)
Called per floor RN for pt having wide complex ventricular rhythm rate 130-140, asymptomatic. Upon my arrival pt found resting in bed, lethargic but arouses easy, bp consistently soft 92/54. HR 60s 1st degree block. Pt seen by Dr.Brackbill earlier tonight aware of frequent irregular rhythms and soft BP per floor RN, 500 NS bolus ordered and infusing. CCMD called and discussed pt rhythm. Agreed that  tachy episodes appears to be burst of wide complex SVT rather than V Tach after reviewing monitor strip. RN advised to monitor pt closely and notify the provider if pt sustains in abnormal rhythm.

## 2014-10-15 NOTE — Progress Notes (Addendum)
Pt had sustained Tachycardia 130-150 from 0029-0032. Pt has no complaints of chest pain or shortness of breath. Brackbill at bedside. Amiodarone 30mg /hr and 500 cc bolus over 5 hours were ordered and infusing. BP 94/54 and HR 60-70. Will continue to monitor.   Alba DestineMisty L Ennis, RN, BSN

## 2014-10-15 NOTE — Progress Notes (Signed)
  Echocardiogram 2D Echocardiogram has been performed.  Matthew Woodward, Matthew Woodward 10/15/2014, 12:34 PM

## 2014-10-15 NOTE — Progress Notes (Signed)
Pt to transfer to 2 Heart bed 9  for higher level of care. Gave report to Abbott Laboratoriesose  RN

## 2014-10-15 NOTE — Progress Notes (Signed)
Pt transferred per bed with all belongings ( has cell phone and charger with him)  to 2 Heart bed 9 . Patients family here visiting aware of transfer.

## 2014-10-15 NOTE — Progress Notes (Signed)
Text Page to DR Ladona Ridgelaylor with immediate return of call - BP 76/42 HR 88 asymptomatic. Orders received for 250 cc NS IV fluid bolus and to draw now BMP. Asked about troponin, MD stated troponin not needed. DR Graciela HusbandsKlein to round on pt today

## 2014-10-15 NOTE — Progress Notes (Signed)
eLink Physician-Brief Progress Note Patient Name: Matthew Woodward DOB: 09/19/1991 MRN: [id number]   Date of Service  10/15/2014  HPI/Events of Note  23 yo with hx of viral myocarditis, non ischemic CM, and recurrent VT.  Transferred from SDU to ICU for lidocaine.  Hemodynamics, oxygenation stable.   eICU Interventions  No additional elink interventions at this time.        Matthew Woodward 10/15/2014, 3:24 PM

## 2014-10-15 NOTE — Progress Notes (Signed)
UR completed 

## 2014-10-15 NOTE — Progress Notes (Signed)
       Patient Name: Matthew Woodward      SUBJECTIVE:*events of last pm noted, basically recurrent VT now slower as well as hypotension  Treated with fluids and amiodarone   Biventricular failure with LV EF 20% with RV failure   MRI>> consistent with myocarditis  CHF has been stable    Past Medical History  Diagnosis Date  . 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 Anxiety   . Ventricular tachycardia     Scheduled Meds:  Scheduled Meds: . antiseptic oral rinse  7 mL Mouth Rinse BID  . enoxaparin (LOVENOX) injection  40 mg Subcutaneous Q24H  . lisinopril  2.5 mg Oral Daily  . loratadine  10 mg Oral Daily  . sotalol  160 mg Oral Q12H   Continuous Infusions: . amiodarone 30 mg/hr (10/15/14 0604)   acetaminophen, ALPRAZolam, ondansetron (ZOFRAN) IV, zolpidem    PHYSICAL EXAM Filed Vitals:   10/14/14 2338 10/15/14 0130 10/15/14 0410 10/15/14 0816  BP: 91/59 92/55 89/54  93/50  Pulse: 68 65 75 67  Temp: 98.8 F (37.1 C)  99.6 F (37.6 C) 98.3 F (36.8 C)  TempSrc: Oral  Oral Oral  Resp: 15 15 19 15   Height:      Weight:   169 lb 8.5 oz (76.9 kg)   SpO2: 99% 99% 100% 100%   Well developed and nourished in no acute distress HENT normal Neck supple with JVP-flat Clear Regular rate and rhythm, no murmurs or gallops Abd-soft with active BS No Clubbing cyanosis edema Skin-warm and dry A & Oriented  Grossly normal sensory and motor function   TELEMETRY: Reviewed telemetry pt in frequent runs with primary simlar morphology     Intake/Output Summary (Last 24 hours) at 10/15/14 0936 Last data filed at 10/15/14 0600  Gross per 24 hour  Intake  495.2 ml  Output    660 ml  Net -164.8 ml    LABS: Basic Metabolic Panel:  Recent Labs Lab 10/14/14 1432 10/15/14 0828  NA 139 137  K 4.3 3.6  CL 102 106  CO2  --  24  GLUCOSE 89 95  BUN 8 6  CREATININE 1.20 1.13  CALCIUM  --  8.6   Cardiac Enzymes: No results for input(s): CKTOTAL, CKMB,  CKMBINDEX, TROPONINI in the last 72 hours. CBC:  Recent Labs Lab 10/14/14 1424 10/14/14 1432  WBC 8.7  --   NEUTROABS 7.5  --   HGB 16.2 17.0  HCT 45.4 50.0  MCV 88.5  --   PLT 155  --      ASSESSMENT AND PLAN:  Active Problems:   VT (ventricular tachycardia)  Will need echo  [facility] Rx of VT options and Issues of trigger With low grade fever? Recurrent myocarditis Will consider transfer for MRI +/- ablation  But for now will use lido>>mexilitene in lieu of amiodarone given age and continue with sotalol at increased dose of 120   Signed, Sherryl MangesSteven Klein MD  10/15/2014

## 2014-10-15 NOTE — Progress Notes (Signed)
alsdo complainig of some anxiety which previously had been a problem  Will use low dose ativan

## 2014-10-15 NOTE — Progress Notes (Signed)
DR Graciela HusbandsKlein ordered Lidocaine bolus and drip to start. Not authorized to have this med given in unit 2C. Text page to PA for cardiology to make aware that transfer orders are needed.

## 2014-10-16 DIAGNOSIS — J029 Acute pharyngitis, unspecified: Secondary | ICD-10-CM

## 2014-10-16 MED ORDER — MEXILETINE HCL 200 MG PO CAPS
200.0000 mg | ORAL_CAPSULE | Freq: Two times a day (BID) | ORAL | Status: DC
  Administered 2014-10-16 – 2014-10-18 (×5): 200 mg via ORAL
  Filled 2014-10-16 (×6): qty 1

## 2014-10-16 MED ORDER — MENTHOL 3 MG MT LOZG
1.0000 | LOZENGE | OROMUCOSAL | Status: DC | PRN
  Administered 2014-10-16 (×2): 3 mg via ORAL
  Filled 2014-10-16: qty 9

## 2014-10-16 NOTE — Progress Notes (Signed)
Matthew DoeBrittney Simmons, PA notified of tachycardia related to activity (pt was up to bathroom and ambulating in room), rate 140s, pt denies dizziness, chest pain or shortness of breath.  Also notified of episode of 9 beat run of vtach noted amid the tachycardia episode at 1821 (strip placed in chart).   During episode of vtach, pt stated he felt lightheaded.  Christiana FuchsB. Simmons requested that pt limit activity tonight.  Will continue to monitor pt closely.

## 2014-10-16 NOTE — Progress Notes (Addendum)
    10/16/14 1131 10/16/14 1132 10/16/14 1133  Vitals  BP --  --  --   MAP (mmHg) --  --  --   Pulse Rate --  --  --   ECG Heart Rate (!) 139 (!) 142 (!) 143  Resp --  --  --      10/16/14 1134 10/16/14 1135 10/16/14 1136  Vitals  BP 103/67 mmHg --  --   MAP (mmHg) 80 --  --   Pulse Rate --  --  --   ECG Heart Rate (!) 145 (!) 143 (!) 142  Resp 19 --  --      10/16/14 1137 10/16/14 1138 10/16/14 1139  Vitals  BP --  --  --   MAP (mmHg) --  --  --   Pulse Rate (!) 140 (!) 139 74  ECG Heart Rate (!) 142 (!) 139 74  Resp 18 17 16      10/16/14 1140  Vitals  BP --   MAP (mmHg) --   Pulse Rate --   ECG Heart Rate 73  Resp --   Pt up to bathroom for bowel movement(1100).  When stood at sink to wash up and brush teeth (1125), heart rate increased to 140s (1131).  Pt stated he felt his heart pounding in his chest.  Denied dizziness, chest pain or shortness of breath.  Assisted back to bed at 1133.  Heart rate returned to baseline rate at 1139.  Will continue to monitor pt closely.

## 2014-10-16 NOTE — Progress Notes (Signed)
    SUBJECTIVE: The patient is doing well today.  At this time, he denies chest pain, shortness of breath, or any new concerns.  Still with intermittent NSVT, on Lidocaine but burden decreased.  CURRENT MEDICATIONS: . antiseptic oral rinse  7 mL Mouth Rinse BID  . enoxaparin (LOVENOX) injection  40 mg Subcutaneous Q24H  . loratadine  10 mg Oral Daily  . LORazepam  0.5 mg Oral BID  . sotalol  120 mg Oral Q12H   . lidocaine 1 mg/min (10/15/14 1245)    OBJECTIVE: Physical Exam: Filed Vitals:   10/16/14 0500 10/16/14 0600 10/16/14 0700 10/16/14 0724  BP: 97/43 85/54 92/65    Pulse: 73 69 67   Temp:    98.8 F (37.1 C)  TempSrc:    Oral  Resp: 15 14 15    Height:      Weight:      SpO2: 98% 96% 98%     Intake/Output Summary (Last 24 hours) at 10/16/14 0815 Last data filed at 10/16/14 0700  Gross per 24 hour  Intake 1206.18 ml  Output   2405 ml  Net -1198.82 ml    Telemetry reveals sinus rhythm with runs of NSVT, burden of VT decreased  GEN- The patient is well appearing, alert and oriented x 3 today.   Head- normocephalic, atraumatic Eyes-  Sclera clear, conjunctiva pink Ears- hearing intact Oropharynx- clear Neck- supple, no JVP Lymph- no cervical lymphadenopathy Lungs- Clear to ausculation bilaterally, normal work of breathing Heart- Regular rate and rhythm, no murmurs, rubs or gallops, PMI not laterally displaced GI- soft, NT, ND, + BS Extremities- no clubbing, cyanosis, or edema Skin- no rash or lesion Psych- euthymic mood, full affect Neuro- strength and sensation are intact  LABS: Basic Metabolic Panel:  Recent Labs  63/10/6010/28/15 1432 10/15/14 0828  NA 139 137  K 4.3 3.6  CL 102 106  CO2  --  24  GLUCOSE 89 95  BUN 8 6  CREATININE 1.20 1.13  CALCIUM  --  8.6   CBC:  Recent Labs  10/14/14 1424 10/14/14 1432  WBC 8.7  --   NEUTROABS 7.5  --   HGB 16.2 17.0  HCT 45.4 50.0  MCV 88.5  --   PLT 155  --     RADIOLOGY: Dg Chest Port 1 View  10/14/2014   CLINICAL DATA:  Diaphoresis and near syncope ; episode of ventricular tachycardia  EXAM: PORTABLE CHEST - 1 VIEW  COMPARISON:  Mar 13, 2014  FINDINGS: Defibrillator present with lead attached to the right ventricle. Lungs clear. Heart is upper normal in size with pulmonary vascularity within normal limits. No pneumothorax. No adenopathy. No bone lesions.  IMPRESSION: No edema or consolidation.  Heart upper normal in size.   Electronically Signed   By: Bretta BangWilliam  Woodruff M.D.   On: 10/14/2014 15:06    ASSESSMENT AND PLAN:  Active Problems:   VT (ventricular tachycardia) chronic systolic heart failure Sore throat Rec: Will transition IV lidocaine to oral mexitil today, transfer to tele, treat sore throat, continue sotalol. I would like to use 160 bid but blood pressure is limiting  Matthew ReevesGregg Johnhenry Tippin,M.D.

## 2014-10-17 NOTE — Progress Notes (Signed)
    SUBJECTIVE: The patient is doing well today.  At this time, he denies chest pain, shortness of breath, or any new concerns.  Episode of NSVT last night associated with pre-syncope.   CURRENT MEDICATIONS: . antiseptic oral rinse  7 mL Mouth Rinse BID  . enoxaparin (LOVENOX) injection  40 mg Subcutaneous Q24H  . loratadine  10 mg Oral Daily  . LORazepam  0.5 mg Oral BID  . mexiletine  200 mg Oral Q12H  . sotalol  120 mg Oral Q12H      OBJECTIVE: Physical Exam: Filed Vitals:   10/17/14 0200 10/17/14 0300 10/17/14 0400 10/17/14 0500  BP: 93/58 94/50 92/57  83/34  Pulse:      Temp:   98.6 F (37 C)   TempSrc:   Oral   Resp: 19 17 17 21   Height:      Weight:      SpO2:   98%     Intake/Output Summary (Last 24 hours) at 10/17/14 0901 Last data filed at 10/16/14 2300  Gross per 24 hour  Intake    707 ml  Output    850 ml  Net   -143 ml    Telemetry reveals sinus rhythm with 2 episodes of sustained VT (<5 minutes), NSVT burden decreased  GEN- The patient is well appearing, alert and oriented x 3 today.   Head- normocephalic, atraumatic Eyes-  Sclera clear, conjunctiva pink Ears- hearing intact Oropharynx- clear Neck- supple, no JVP Lymph- no cervical lymphadenopathy Lungs- Clear to ausculation bilaterally, normal work of breathing Heart- Regular rate and rhythm, no murmurs, rubs or gallops, PMI not laterally displaced GI- soft, NT, ND, + BS Extremities- no clubbing, cyanosis, or edema Skin- no rash or lesion Psych- euthymic mood, full affect Neuro- strength and sensation are intact  LABS: Basic Metabolic Panel:  Recent Labs  16/07/9611/28/15 1432 10/15/14 0828  NA 139 137  K 4.3 3.6  CL 102 106  CO2  --  24  GLUCOSE 89 95  BUN 8 6  CREATININE 1.20 1.13  CALCIUM  --  8.6   CBC:  Recent Labs  10/14/14 1424 10/14/14 1432  WBC 8.7  --   NEUTROABS 7.5  --   HGB 16.2 17.0  HCT 45.4 50.0  MCV 88.5  --   PLT 155  --     RADIOLOGY: Dg Chest Port 1 View  10/14/2014   CLINICAL DATA:  Diaphoresis and near syncope ; episode of ventricular tachycardia  EXAM: PORTABLE CHEST - 1 VIEW  COMPARISON:  Mar 13, 2014  FINDINGS: Defibrillator present with lead attached to the right ventricle. Lungs clear. Heart is upper normal in size with pulmonary vascularity within normal limits. No pneumothorax. No adenopathy. No bone lesions.  IMPRESSION: No edema or consolidation.  Heart upper normal in size.   Electronically Signed   By: Bretta BangWilliam  Woodruff M.D.   On: 10/14/2014 15:06    ASSESSMENT AND PLAN:  Active Problems:   VT (ventricular tachycardia) chronic systolic heart failure Sore throat Rec: He has transitioned to mexitil, and will transfer to tele, treat sore throat, continue sotalol 120 bid. I would like to use 160 bid but blood pressure is limiting. [facility]. Hopefully home  Tomorrow.  Leonia ReevesGregg Taylor,M.D.

## 2014-10-18 ENCOUNTER — Telehealth: Payer: Self-pay | Admitting: Physician Assistant

## 2014-10-18 ENCOUNTER — Inpatient Hospital Stay (HOSPITAL_COMMUNITY): Payer: BC Managed Care – PPO

## 2014-10-18 ENCOUNTER — Other Ambulatory Visit: Payer: Self-pay | Admitting: Physician Assistant

## 2014-10-18 MED ORDER — SOTALOL HCL 120 MG PO TABS: 120.0000 mg | ORAL_TABLET | Freq: Two times a day (BID) | ORAL | Status: AC

## 2014-10-18 MED ORDER — MEXILETINE HCL 200 MG PO CAPS: 200.0000 mg | ORAL_CAPSULE | Freq: Two times a day (BID) | ORAL | Status: AC

## 2014-10-18 MED ORDER — DEXTROMETHORPHAN POLISTIREX 30 MG/5ML PO LQCR
15.0000 mg | Freq: Two times a day (BID) | ORAL | Status: DC | PRN
  Administered 2014-10-18: 15 mg via ORAL
  Filled 2014-10-18 (×2): qty 5

## 2014-10-18 MED ORDER — LORAZEPAM 0.5 MG PO TABS: 0.5000 mg | ORAL_TABLET | Freq: Two times a day (BID) | ORAL | Status: AC

## 2014-10-18 MED ORDER — DEXTROMETHORPHAN POLISTIREX 30 MG/5ML PO LQCR: 15.0000 mg | Freq: Two times a day (BID) | ORAL | Status: AC | PRN

## 2014-10-18 MED ORDER — ALPRAZOLAM 0.25 MG PO TABS: 0.5000 mg | ORAL_TABLET | Freq: Two times a day (BID) | ORAL | Status: DC | PRN

## 2014-10-18 NOTE — Discharge Summary (Signed)
Physician Discharge Summary     Primary Cardiologist: Jenel Lucks ([facility]) Electrophysiologist: Gerre Pebbles ([facility])  Patient ID: GIL INGWERSEN MRN: [id number] DOB/AGE: Dec 24, 1990 24 y.o.  Admit date: 10/14/2014 Discharge date: 10/18/2014  Admission Diagnoses:  VT  Discharge Diagnoses:  Active Problems:   VT (ventricular tachycardia)   Chronic systolic heart failure   Cough   Anxiety  Discharged Condition: stable  Hospital Course:   CHAVEZ ROSOL is a 24 y.o. 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 He is followed at [facility] and is s/p STJ single chamber ICD implantation in 2012. His last appropriate ICD therapy was in May of this year for monomorphic VT at a cycle length of . At that time, his Coreg was changed to Sotalol 80 mg twice daily. He did well until this morning without recurrent ICD therapies. This morning, he developed weakness and fatigue and called EMS for evaluation. On their arrival, he was in a WCT at 200bpm. He was given amiodarone and transferred to [facility] for further evaluation. He had persistent ventricular tachycardia at rates of 150-170bpm and was given more amiodarone with quieting of ventricular arrhythmias.  He states that he has been in good health recently. He has been trying to exercise more and has done so without limitation. He has not had recent chest pain, shortness of breath, LE edema, fevers, chills, nausea or vomiting. He has almost daily palpitations that he has had for years and are not associated with syncope or pre-syncope.   Home meds include Sotalol  1 tablet twice daily and Lisinopril 2.5mg  1 tablet daily. HF medications have been unable to be uptitrated further due to hypotension. He recently underwent CPX testing per records in [facility] with some cardiac limitation noted.   The patient was admitted by EP.  His sotalol was increased 160 mg  twice daily and then subsequently decreased to 120 mg twice daily..  We have reprogrammed device to treat VT above 200/min.  amiodarone was discontinued due to his 24 and he was started on lidocaine and then changed to oral mexiletine.  2-D echocardiogram was completed and revealed an ejection fraction of 40-45% with diffuse hypokinesis. LV was really dilated.  Moderate TR.  On December 30 in the evening patient was tachycardiac in the 140s during ambulation. He was asymptomatic. He was noted to have a 9 beat run of V. Tach..  We treated his cough with Robitussin and his anxiety with Ativan.  Chest x-ray on January 1 showed no active cardiopulmonary disease. The patient was seen by Dr. Ladona Ridgel who felt he was stable for DC home.  Patient will follow-up with his electrophysiologist, Dr. Maisie Fus, at [facility].  [place] metoprolol had been discontinued.   Consults: PCCM  Significant Diagnostic Studies:  CHEST 2 VIEW  COMPARISON: 10/14/2014  FINDINGS: There is a single chamber ICD/pacer lead from the left. Orientation is unchanged. Borderline cardiomegaly is stable from prior. Aortic contours are negative. There is no edema, consolidation, effusion, or pneumothorax.  IMPRESSION: No active cardiopulmonary disease. 2-D echocardiogram Study Conclusions  - Left ventricle: The cavity size was severely dilated. Wall thickness was normal. Systolic function was mildly to moderately reduced. The estimated ejection fraction was in the range of 40% to 45%. Diffuse hypokinesis. Left ventricular diastolic function parameters were normal. - Right atrium: The atrium was mildly dilated. - Tricuspid valve: There was moderate regurgitation.  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  Treatments: See above  Discharge Exam: Blood pressure 103/58, pulse 70, temperature 98.2 F (36.8 C), temperature source Oral, resp. rate 16, height  6' (1.829 m), weight 157 lb 4.8 oz (71.351 kg), SpO2 99 %.   Disposition: [facility]      Discharge Instructions    Diet - low sodium heart healthy    Complete by:  As directed      Increase activity slowly    Complete by:  As directed             Medication List    TAKE these medications        acetaminophen 325 MG tablet  Commonly known as:  TYLENOL  Take 650 mg by mouth every 6 (six) hours as needed for mild pain.     ALPRAZolam 0.25 MG tablet  Commonly known as:  XANAX  Take 2 tablets (0.5 mg total) by mouth 2 (two) times daily as needed for anxiety.     cetirizine 10 MG tablet  Commonly known as:  ZYRTEC  Take 10 mg by mouth daily.     dextromethorphan 30 MG/5ML liquid  Commonly known as:  DELSYM  Take 2.5 mLs (15 mg total) by mouth 2 (two) times daily between meals as needed for cough.     lisinopril 2.5 MG tablet  Commonly known as:  PRINIVIL,ZESTRIL  Take 2.5 mg by mouth daily.     LORazepam 0.5 MG tablet  Commonly known as:  ATIVAN  Take 1 tablet (0.5 mg total) by mouth 2 (two) times daily.     mexiletine 200 MG capsule  Commonly known as:  MEXITIL  Take 1 capsule (200 mg total) by mouth every 12 (twelve) hours.     sotalol 120 MG tablet  Commonly known as:  BETAPACE  Take 1 tablet (120 mg total) by mouth every 12 (twelve) hours.       Follow-up Information    Follow up with Dr Maisie Fus.   Contact information:   [facility]     Greater than 30 minutes was spent completing the patient's discharge.   SignedWilburt Finlay, PAC 10/18/2014, 8:42 AM  Leonia Reeves.D.

## 2014-10-18 NOTE — Telephone Encounter (Signed)
Patient's mother contacted the after hour service as her pharmacy has no mexilitine until Monday, after calling several [facility], the closest one is in [place] which is 2 hrs away from [place], after discussing with pharmacy, [facility] pharmacy has agreed to give her 8 tablets to cover her until Monday.   Ramond Dial PA Pager: [phone]

## 2014-10-18 NOTE — Progress Notes (Signed)
Patient ID: Matthew Woodward, male   DOB: [birth year], 24 y.o.   MRN: [id number]    SUBJECTIVE: The patient is doing well today except c/o anxiety and a cough.  At this time, he denies chest pain, shortness of breath, or any new concerns.  No VT over the past 36 hours.   CURRENT MEDICATIONS: . antiseptic oral rinse  7 mL Mouth Rinse BID  . enoxaparin (LOVENOX) injection  40 mg Subcutaneous Q24H  . loratadine  10 mg Oral Daily  . LORazepam  0.5 mg Oral BID  . mexiletine  200 mg Oral Q12H  . sotalol  120 mg Oral Q12H      OBJECTIVE: Physical Exam: Filed Vitals:   10/17/14 1400 10/17/14 1441 10/17/14 2000 10/18/14 0630  BP: 102/62 101/63 107/59 103/58  Pulse:  67 64 70  Temp:  98.4 F (36.9 C) 98.2 F (36.8 C) 98.2 F (36.8 C)  TempSrc:  Oral Oral Oral  Resp: Height:      Weight:    157 lb 4.8 oz (71.351 kg)  SpO2:  99% 99% 99%    Intake/Output Summary (Last 24 hours) at 10/18/14 8295 Last data filed at 10/18/14 0500  Gross per 24 hour  Intake    700 ml  Output   1075 ml  Net   -375 ml    Telemetry reveals sinus rhythm   GEN- The patient is well appearing, alert and oriented x 3 today.   Head- normocephalic, atraumatic Eyes-  Sclera clear, conjunctiva pink Ears- hearing intact Oropharynx- clear Neck- supple, no JVP Lymph- no cervical lymphadenopathy Lungs- Clear to ausculation bilaterally, normal work of breathing Heart- Regular rate and rhythm, no murmurs, rubs or gallops, PMI not laterally displaced GI- soft, NT, ND, + BS Extremities- no clubbing, cyanosis, or edema Skin- no rash or lesion Psych- euthymic mood, full affect Neuro- strength and sensation are intact  LABS: Basic Metabolic Panel:  Recent Labs  62/13/08 0828  NA 137  K 3.6  CL 106  CO2 24  GLUCOSE 95  BUN 6  CREATININE 1.13  CALCIUM 8.6   CBC: No results for input(s): WBC, NEUTROABS, HGB, HCT, MCV, PLT in the last 72 hours.  RADIOLOGY: Dg Chest Port 1 View 10/14/2014    CLINICAL DATA:  Diaphoresis and near syncope ; episode of ventricular tachycardia  EXAM: PORTABLE CHEST - 1 VIEW  COMPARISON:  Mar 13, 2014  FINDINGS: Defibrillator present with lead attached to the right ventricle. Lungs clear. Heart is upper normal in size with pulmonary vascularity within normal limits. No pneumothorax. No adenopathy. No bone lesions.  IMPRESSION: No edema or consolidation.  Heart upper normal in size.   Electronically Signed   By: Bretta Bang M.D.   On: 10/14/2014 15:06    ASSESSMENT AND PLAN:  Active Problems:   VT (ventricular tachycardia) chronic systolic heart failure Cough anxiety Rec: He is ready for discharge home. He will be discharged on sotalol 120 bid, Mexitil 200 bid, lisinopril 2.5 mg daily, stop coreg, and metoprolol. He may be discharged on lorazepam 0.5 mg twice daily prn anxiety, and Robitussin DM for cough. Followup with Dr. Maisie Fus at [facility].  Leonia Reeves.D.

## 2015-04-01 IMAGING — DX DG CHEST 2V
2 series · 2 of 2 positions shown · non-contrast
Comparison: 10/14/2014

CLINICAL DATA: Persistent cough

EXAM:
CHEST  2 VIEW

[chest pa]
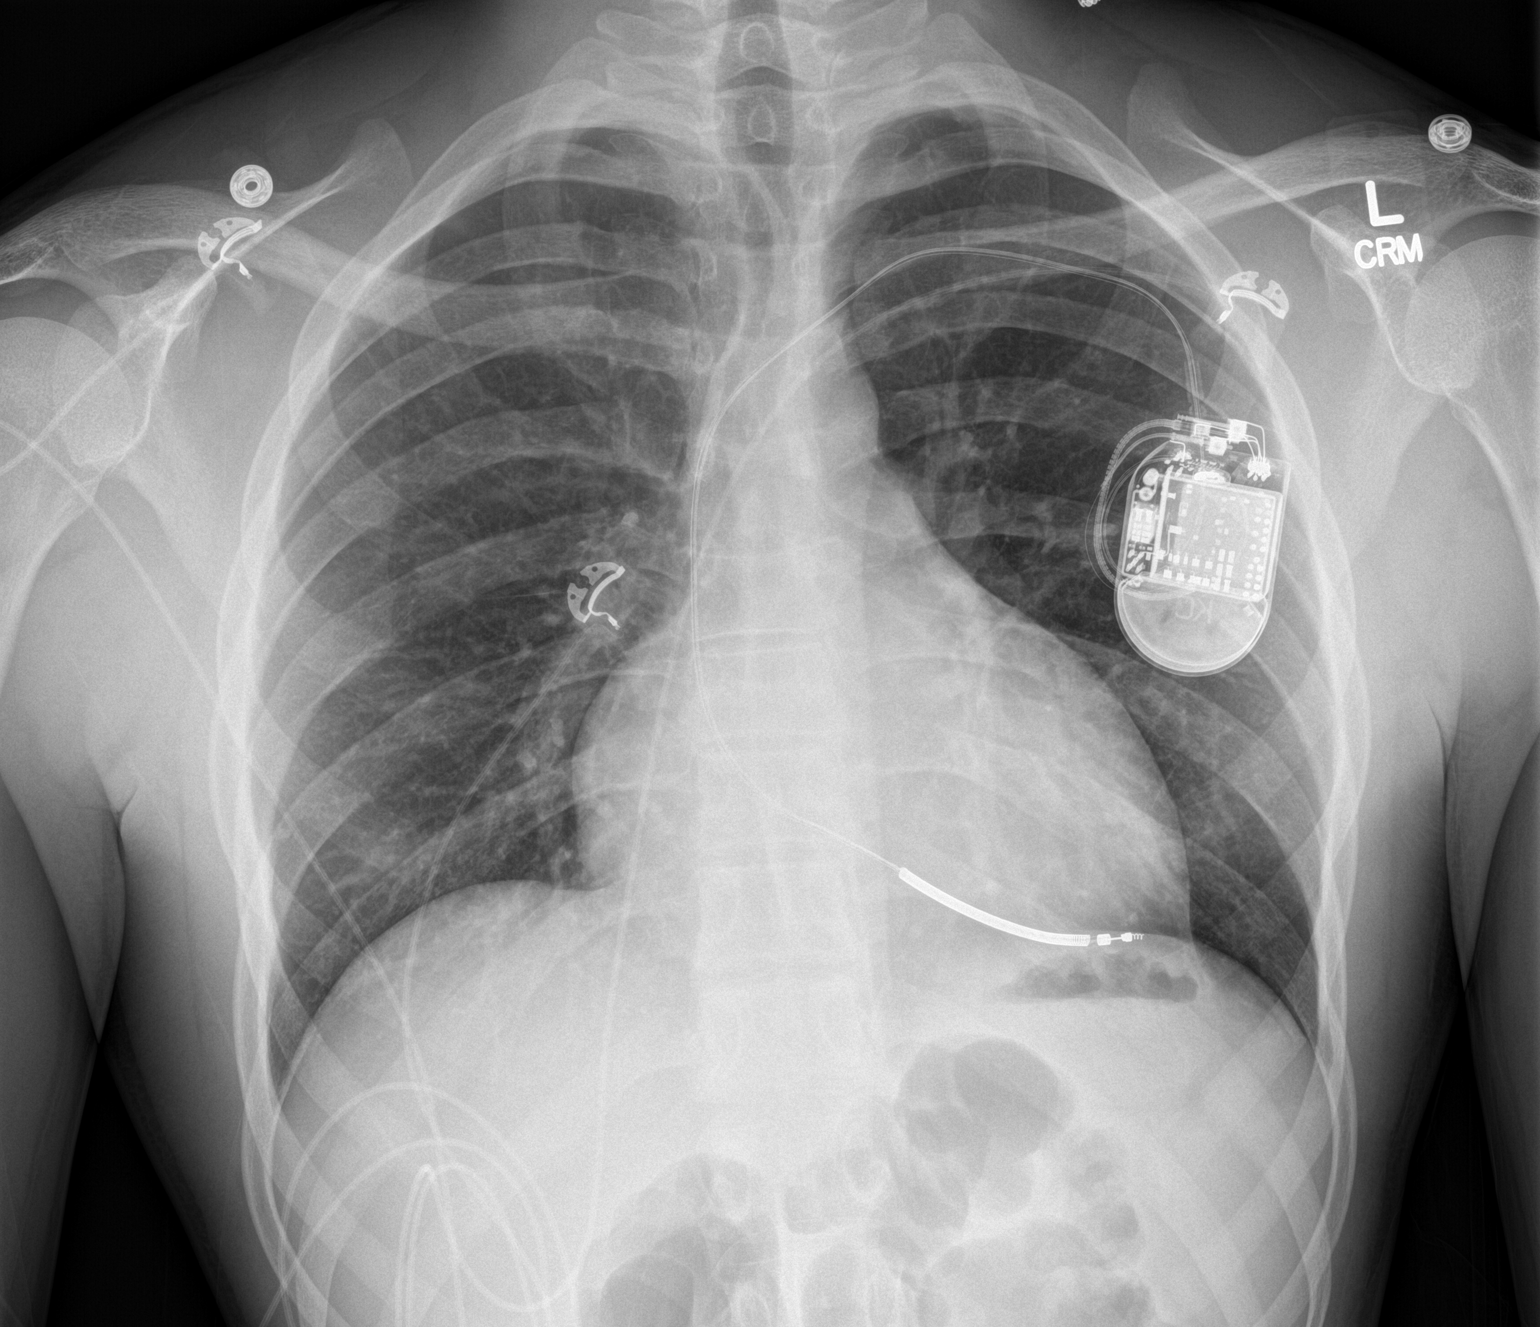

[chest lat]
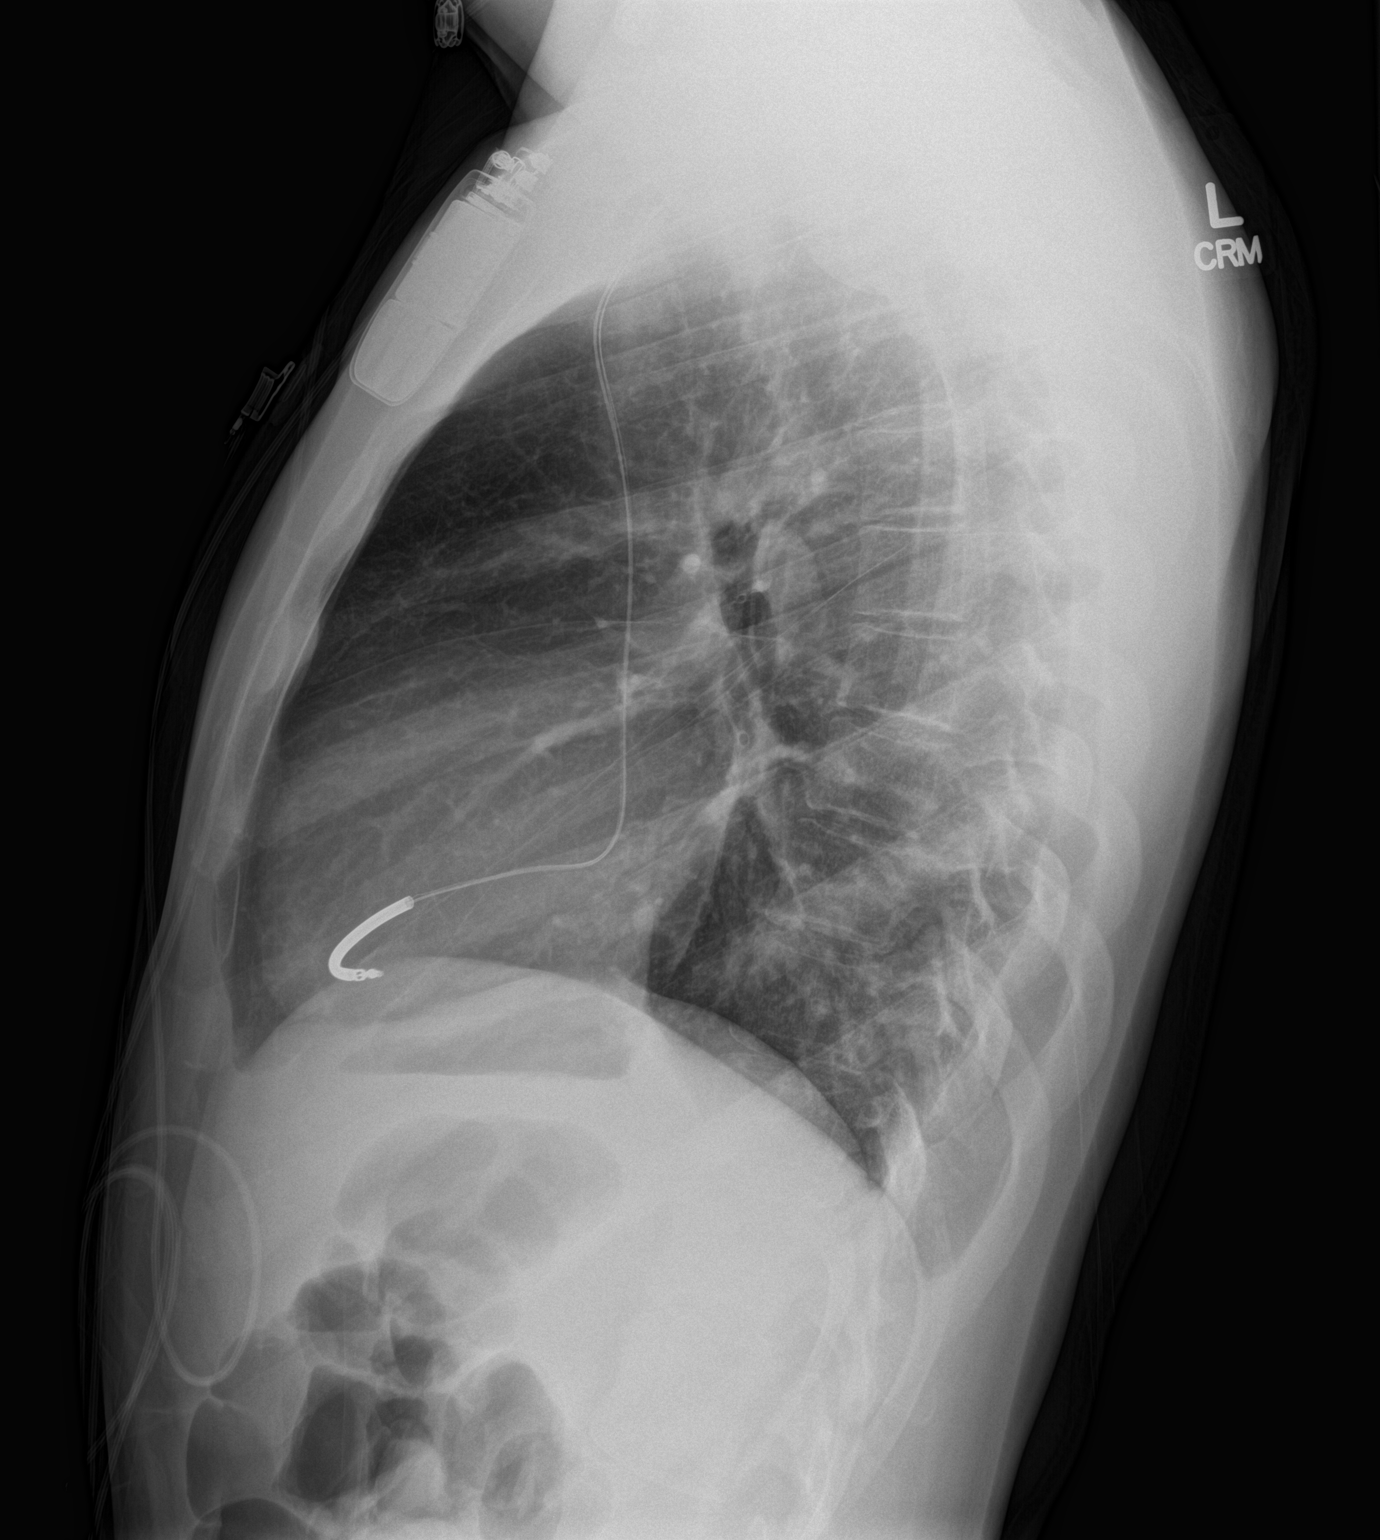

[2 of 2 positions shown; findings below may reference images not displayed]

FINDINGS: There is a single chamber ICD/pacer lead from the left. Orientation
is unchanged. Borderline cardiomegaly is stable from prior. Aortic
contours are negative. There is no edema, consolidation, effusion,
or pneumothorax.
IMPRESSION: No active cardiopulmonary disease.

## 2015-10-08 ENCOUNTER — Ambulatory Visit: Payer: Self-pay | Admitting: Internal Medicine
